# Patient Record
Sex: Female | Born: 1970 | Race: White | Hispanic: Yes | Marital: Single | Smoking: Never smoker
Health system: Southern US, Community
[De-identification: ages and names within clinical notes are randomized; demographics above are authoritative.]

## PROBLEM LIST (undated history)

## (undated) HISTORY — PX: CHOLECYSTECTOMY: SHX55

---

## 1998-09-25 ENCOUNTER — Ambulatory Visit (HOSPITAL_COMMUNITY): Admission: RE | Admit: 1998-09-25 | Discharge: 1998-09-25 | Payer: Self-pay | Admitting: Obstetrics

## 1998-12-23 ENCOUNTER — Inpatient Hospital Stay (HOSPITAL_COMMUNITY): Admission: AD | Admit: 1998-12-23 | Discharge: 1998-12-23 | Payer: Self-pay | Admitting: Obstetrics

## 1998-12-29 ENCOUNTER — Inpatient Hospital Stay (HOSPITAL_COMMUNITY): Admission: AD | Admit: 1998-12-29 | Discharge: 1998-12-29 | Payer: Self-pay | Admitting: *Deleted

## 1999-01-24 ENCOUNTER — Inpatient Hospital Stay (HOSPITAL_COMMUNITY): Admission: AD | Admit: 1999-01-24 | Discharge: 1999-01-24 | Payer: Self-pay | Admitting: *Deleted

## 1999-02-14 ENCOUNTER — Inpatient Hospital Stay (HOSPITAL_COMMUNITY): Admission: AD | Admit: 1999-02-14 | Discharge: 1999-02-16 | Payer: Self-pay | Admitting: *Deleted

## 2004-09-15 ENCOUNTER — Emergency Department (HOSPITAL_COMMUNITY): Admission: EM | Admit: 2004-09-15 | Discharge: 2004-09-15 | Payer: Self-pay | Admitting: Emergency Medicine

## 2006-03-03 ENCOUNTER — Inpatient Hospital Stay (HOSPITAL_COMMUNITY): Admission: AD | Admit: 2006-03-03 | Discharge: 2006-03-03 | Payer: Self-pay | Admitting: Obstetrics and Gynecology

## 2006-04-03 ENCOUNTER — Ambulatory Visit: Payer: Self-pay | Admitting: Family Medicine

## 2006-05-01 ENCOUNTER — Ambulatory Visit: Payer: Self-pay | Admitting: Family Medicine

## 2006-05-04 ENCOUNTER — Ambulatory Visit (HOSPITAL_COMMUNITY): Admission: RE | Admit: 2006-05-04 | Discharge: 2006-05-04 | Payer: Self-pay | Admitting: Obstetrics and Gynecology

## 2006-05-12 ENCOUNTER — Ambulatory Visit: Payer: Self-pay | Admitting: Family Medicine

## 2006-05-30 ENCOUNTER — Ambulatory Visit: Payer: Self-pay | Admitting: Family Medicine

## 2006-06-28 ENCOUNTER — Ambulatory Visit: Payer: Self-pay | Admitting: Family Medicine

## 2006-07-06 ENCOUNTER — Ambulatory Visit: Payer: Self-pay | Admitting: Family Medicine

## 2006-07-20 ENCOUNTER — Ambulatory Visit: Payer: Self-pay | Admitting: *Deleted

## 2006-07-31 ENCOUNTER — Ambulatory Visit (HOSPITAL_COMMUNITY): Admission: RE | Admit: 2006-07-31 | Discharge: 2006-07-31 | Payer: Self-pay | Admitting: Family Medicine

## 2006-08-03 ENCOUNTER — Ambulatory Visit: Payer: Self-pay | Admitting: Obstetrics & Gynecology

## 2006-08-14 ENCOUNTER — Ambulatory Visit: Payer: Self-pay | Admitting: Obstetrics & Gynecology

## 2006-08-21 ENCOUNTER — Ambulatory Visit: Payer: Self-pay | Admitting: Obstetrics & Gynecology

## 2006-08-24 ENCOUNTER — Ambulatory Visit: Payer: Self-pay | Admitting: Gynecology

## 2006-08-28 ENCOUNTER — Ambulatory Visit: Payer: Self-pay | Admitting: Obstetrics & Gynecology

## 2006-08-31 ENCOUNTER — Ambulatory Visit: Payer: Self-pay | Admitting: Obstetrics and Gynecology

## 2006-09-04 ENCOUNTER — Ambulatory Visit: Payer: Self-pay | Admitting: Obstetrics & Gynecology

## 2006-09-07 ENCOUNTER — Ambulatory Visit: Payer: Self-pay | Admitting: Family Medicine

## 2006-09-11 ENCOUNTER — Ambulatory Visit: Payer: Self-pay | Admitting: Family Medicine

## 2006-09-13 ENCOUNTER — Inpatient Hospital Stay (HOSPITAL_COMMUNITY): Admission: AD | Admit: 2006-09-13 | Discharge: 2006-09-14 | Payer: Self-pay | Admitting: Obstetrics and Gynecology

## 2006-09-13 ENCOUNTER — Ambulatory Visit: Payer: Self-pay | Admitting: *Deleted

## 2006-09-14 ENCOUNTER — Ambulatory Visit: Payer: Self-pay | Admitting: Obstetrics & Gynecology

## 2006-09-18 ENCOUNTER — Ambulatory Visit (HOSPITAL_COMMUNITY): Admission: RE | Admit: 2006-09-18 | Discharge: 2006-09-18 | Payer: Self-pay | Admitting: Obstetrics and Gynecology

## 2006-09-18 ENCOUNTER — Ambulatory Visit: Payer: Self-pay | Admitting: *Deleted

## 2006-09-20 ENCOUNTER — Ambulatory Visit: Payer: Self-pay | Admitting: Obstetrics and Gynecology

## 2006-09-20 ENCOUNTER — Inpatient Hospital Stay (HOSPITAL_COMMUNITY): Admission: AD | Admit: 2006-09-20 | Discharge: 2006-09-23 | Payer: Self-pay | Admitting: Obstetrics and Gynecology

## 2008-11-06 ENCOUNTER — Ambulatory Visit: Payer: Self-pay | Admitting: Obstetrics & Gynecology

## 2008-11-06 ENCOUNTER — Encounter: Payer: Self-pay | Admitting: Family Medicine

## 2008-11-06 ENCOUNTER — Other Ambulatory Visit: Admission: RE | Admit: 2008-11-06 | Discharge: 2008-11-06 | Payer: Self-pay | Admitting: Obstetrics & Gynecology

## 2008-11-06 LAB — CONVERTED CEMR LAB
Hemoglobin: 12.3 g/dL (ref 12.0–15.0)
MCHC: 33.3 g/dL (ref 30.0–36.0)
Platelets: 303 10*3/uL (ref 150–400)
RBC: 4.38 M/uL (ref 3.87–5.11)
RDW: 14.2 % (ref 11.5–15.5)
TSH: 1.73 microintl units/mL (ref 0.350–4.500)

## 2008-11-12 ENCOUNTER — Ambulatory Visit (HOSPITAL_COMMUNITY): Admission: RE | Admit: 2008-11-12 | Discharge: 2008-11-12 | Payer: Self-pay | Admitting: Obstetrics & Gynecology

## 2008-12-04 ENCOUNTER — Ambulatory Visit: Payer: Self-pay | Admitting: Family Medicine

## 2009-09-06 ENCOUNTER — Emergency Department (HOSPITAL_COMMUNITY): Admission: EM | Admit: 2009-09-06 | Discharge: 2009-09-07 | Payer: Self-pay | Admitting: Emergency Medicine

## 2009-10-22 ENCOUNTER — Emergency Department (HOSPITAL_COMMUNITY): Admission: EM | Admit: 2009-10-22 | Discharge: 2009-10-22 | Payer: Self-pay | Admitting: Emergency Medicine

## 2009-11-04 ENCOUNTER — Ambulatory Visit (HOSPITAL_COMMUNITY): Admission: RE | Admit: 2009-11-04 | Discharge: 2009-11-04 | Payer: Self-pay | Admitting: Surgery

## 2010-09-28 LAB — PREGNANCY, URINE: Preg Test, Ur: NEGATIVE

## 2010-09-29 LAB — CBC
Platelets: 249 10*3/uL (ref 150–400)
RDW: 13.9 % (ref 11.5–15.5)
WBC: 6.1 10*3/uL (ref 4.0–10.5)
WBC: 7.6 10*3/uL (ref 4.0–10.5)

## 2010-09-29 LAB — COMPREHENSIVE METABOLIC PANEL
ALT: 83 U/L — ABNORMAL HIGH (ref 0–35)
AST: 25 U/L (ref 0–37)
AST: 49 U/L — ABNORMAL HIGH (ref 0–37)
Albumin: 4.2 g/dL (ref 3.5–5.2)
Alkaline Phosphatase: 71 U/L (ref 39–117)
BUN: 10 mg/dL (ref 6–23)
CO2: 29 mEq/L (ref 19–32)
CO2: 27 mEq/L (ref 19–32)
Calcium: 9.7 mg/dL (ref 8.4–10.5)
Calcium: 9.3 mg/dL (ref 8.4–10.5)
Chloride: 104 mEq/L (ref 96–112)
Chloride: 101 mEq/L (ref 96–112)
Creatinine, Ser: 0.81 mg/dL (ref 0.4–1.2)
Creatinine, Ser: 0.64 mg/dL (ref 0.4–1.2)
GFR calc Af Amer: >60 mL/min (ref >60)
GFR calc non Af Amer: >60 mL/min (ref >60)
Glucose, Bld: 86 mg/dL (ref 70–99)
Potassium: 3.7 mEq/L (ref 3.5–5.1)
Sodium: 135 mEq/L (ref 135–145)
Total Bilirubin: 0.4 mg/dL (ref 0.3–1.2)
Total Protein: 8 g/dL (ref 6.0–8.3)

## 2010-09-29 LAB — URINALYSIS, ROUTINE W REFLEX MICROSCOPIC
Bilirubin Urine: NEGATIVE
Bilirubin Urine: NEGATIVE
Glucose, UA: NEGATIVE mg/dL
Hgb urine dipstick: NEGATIVE
Ketones, ur: NEGATIVE mg/dL
Ketones, ur: NEGATIVE mg/dL
Leukocytes, UA: NEGATIVE
Nitrite: NEGATIVE
Nitrite: NEGATIVE
Protein, ur: NEGATIVE mg/dL
Specific Gravity, Urine: 1.012 (ref 1.005–1.030)
Urobilinogen, UA: 0.2 mg/dL (ref 0.0–1.0)
pH: 7.5 (ref 5.0–8.0)
pH: 7 (ref 5.0–8.0)

## 2010-09-29 LAB — DIFFERENTIAL
Basophils Absolute: 0 10*3/uL (ref 0.0–0.1)
Basophils Absolute: 0.1 10*3/uL (ref 0.0–0.1)
Basophils Relative: 0 % (ref 0–1)
Basophils Relative: 1 % (ref 0–1)
Eosinophils Absolute: 0 10*3/uL (ref 0.0–0.7)
Eosinophils Absolute: 0.1 10*3/uL (ref 0.0–0.7)
Eosinophils Relative: 0 % (ref 0–5)
Eosinophils Relative: 1 % (ref 0–5)
Lymphocytes Relative: 32 % (ref 12–46)
Lymphs Abs: 1.9 10*3/uL (ref 0.7–4.0)
Lymphs Abs: 2.4 10*3/uL (ref 0.7–4.0)
Monocytes Absolute: 0.5 10*3/uL (ref 0.1–1.0)
Monocytes Absolute: 0.6 10*3/uL (ref 0.1–1.0)
Monocytes Relative: 8 % (ref 3–12)
Monocytes Relative: 8 % (ref 3–12)
Neutro Abs: 3.6 10*3/uL (ref 1.7–7.7)
Neutrophils Relative %: 59 % (ref 43–77)
Neutrophils Relative %: 60 % (ref 43–77)

## 2010-09-29 LAB — URINE MICROSCOPIC-ADD ON

## 2010-09-29 LAB — URINE CULTURE: Colony Count: 2000

## 2010-09-29 LAB — LIPASE, BLOOD: Lipase: 20 U/L (ref 11–59)

## 2010-09-29 LAB — POCT PREGNANCY, URINE: Preg Test, Ur: NEGATIVE

## 2010-11-23 NOTE — Group Therapy Note (Signed)
NAMESIMARA, Karen Kidd     ACCOUNT NO.:  [id number]   MEDICAL RECORD NO.:  [id number]          PATIENT TYPE:  WOC   LOCATION:  [facility]                   FACILITY:  [facility]   PHYSICIAN:  Allie Bossier, MD        DATE OF BIRTH:  1970-08-24   DATE OF SERVICE:  11/06/2008                                  CLINIC NOTE   [name] is a 40 year old married white gravida 6, para 6 who has been  having heavy periods at any time that she is not pregnant.  She is  currently using condoms for birth control.  Initially she said her  periods are once a month lasting 4 days, but on further elucidation she  does admit that sometimes she will have 2 periods in a month.  She says  that they are very heavy, but she will try one Tylenol without relief.  She has not tried any Motrin to this point.  There has been no workup  done yet.   PHYSICAL EXAMINATION:  Her uterus is normal size and shape, anteverted  and mobile.  Her adnexa are nontender without masses.  She is currently  having her period.   I did an endometrial biopsy, which yielded large amount of tissue.  I am  ordering an ultrasound, a CBC, and TSH and she will follow up when those  results are available.  In the meantime, I have given her prescription  for 800 of Motrin to use every 8 hours.      Allie Bossier, MD     MCD/MEDQ  D:  11/06/2008  T:  11/07/2008  Job:  [id number]

## 2010-11-24 ENCOUNTER — Emergency Department (HOSPITAL_COMMUNITY): Payer: Self-pay

## 2010-11-24 ENCOUNTER — Emergency Department (HOSPITAL_COMMUNITY)
Admission: EM | Admit: 2010-11-24 | Discharge: 2010-11-24 | Disposition: A | Discharge status: Home / Self Care | Payer: Self-pay | Attending: Emergency Medicine | Admitting: Emergency Medicine

## 2010-11-24 ENCOUNTER — Other Ambulatory Visit (HOSPITAL_COMMUNITY): Payer: Self-pay

## 2010-11-24 DIAGNOSIS — R1011 Right upper quadrant pain: Secondary | ICD-10-CM | POA: Insufficient documentation

## 2010-11-24 DIAGNOSIS — Z79899 Other long term (current) drug therapy: Secondary | ICD-10-CM | POA: Insufficient documentation

## 2010-11-24 DIAGNOSIS — G8929 Other chronic pain: Secondary | ICD-10-CM | POA: Insufficient documentation

## 2010-11-24 LAB — COMPREHENSIVE METABOLIC PANEL
ALT: 14 U/L (ref 0–35)
AST: 18 U/L (ref 0–37)
Albumin: 3.6 g/dL (ref 3.5–5.2)
Alkaline Phosphatase: 57 U/L (ref 39–117)
BUN: 9 mg/dL (ref 6–23)
CO2: 24 mEq/L (ref 19–32)
Calcium: 9.1 mg/dL (ref 8.4–10.5)
Chloride: 105 mEq/L (ref 96–112)
Creatinine, Ser: 0.5 mg/dL (ref 0.4–1.2)
GFR calc Af Amer: >60 mL/min (ref >60)
GFR calc non Af Amer: >60 mL/min (ref >60)
Glucose, Bld: 85 mg/dL (ref 70–99)
Potassium: 3.7 mEq/L (ref 3.5–5.1)
Sodium: 139 mEq/L (ref 135–145)
Total Bilirubin: 0.2 mg/dL — ABNORMAL LOW (ref 0.3–1.2)
Total Protein: 7.2 g/dL (ref 6.0–8.3)

## 2010-11-24 LAB — POCT PREGNANCY, URINE: Preg Test, Ur: NEGATIVE

## 2010-11-24 LAB — LIPASE, BLOOD: Lipase: 34 U/L (ref 11–59)

## 2010-11-24 LAB — CBC
HCT: 33.1 % — ABNORMAL LOW (ref 36.0–46.0)
Hemoglobin: 10.6 g/dL — ABNORMAL LOW (ref 12.0–15.0)
MCH: 25.6 pg — ABNORMAL LOW (ref 26.0–34.0)
MCHC: 32 g/dL (ref 30.0–36.0)
MCV: 80 fL (ref 78.0–100.0)
Platelets: 279 10*3/uL (ref 150–400)
RBC: 4.14 MIL/uL (ref 3.87–5.11)
RDW: 13.9 % (ref 11.5–15.5)
WBC: 5.5 10*3/uL (ref 4.0–10.5)

## 2010-11-24 LAB — URINALYSIS, ROUTINE W REFLEX MICROSCOPIC
Glucose, UA: NEGATIVE mg/dL
Nitrite: NEGATIVE
Specific Gravity, Urine: 1.015 (ref 1.005–1.030)
pH: 6.5 (ref 5.0–8.0)

## 2010-11-24 LAB — DIFFERENTIAL
Basophils Absolute: 0 10*3/uL (ref 0.0–0.1)
Basophils Relative: 1 % (ref 0–1)
Eosinophils Absolute: 0.1 10*3/uL (ref 0.0–0.7)
Eosinophils Relative: 1 % (ref 0–5)
Lymphocytes Relative: 29 % (ref 12–46)
Lymphs Abs: 1.6 10*3/uL (ref 0.7–4.0)
Monocytes Absolute: 0.4 10*3/uL (ref 0.1–1.0)
Monocytes Relative: 7 % (ref 3–12)
Neutro Abs: 3.4 10*3/uL (ref 1.7–7.7)
Neutrophils Relative %: 63 % (ref 43–77)

## 2010-11-24 LAB — URINE MICROSCOPIC-ADD ON

## 2010-11-24 MED ORDER — IOHEXOL 300 MG/ML  SOLN
80.0000 mL | Freq: Once | INTRAMUSCULAR | Status: AC | PRN
  Administered 2010-11-24: 80 mL via INTRAVENOUS

## 2012-12-20 IMAGING — CT CT ABD-PELV W/ CM
2 of 5 series · 17 of 46 positions shown, 19 images · IV contrast (APPLIED)
Comparison: 09/15/2004.

CLINICAL DATA: Abdominal pain.  Right upper quadrant pain.

CT ABDOMEN AND PELVIS WITH CONTRAST
TECHNIQUE: Multidetector CT imaging of the abdomen and pelvis was
performed following the standard protocol during bolus
administration of intravenous contrast.
Contrast: 80 ml Zmnipaque-0HH

[Series 2: abd/pelv with 5.0 b31f st · axial · 0.98mm/px · z∈[+802,+1212]mm · 14 of 92 slices shown, 16 images]
[im 5/92  soft-tissue]
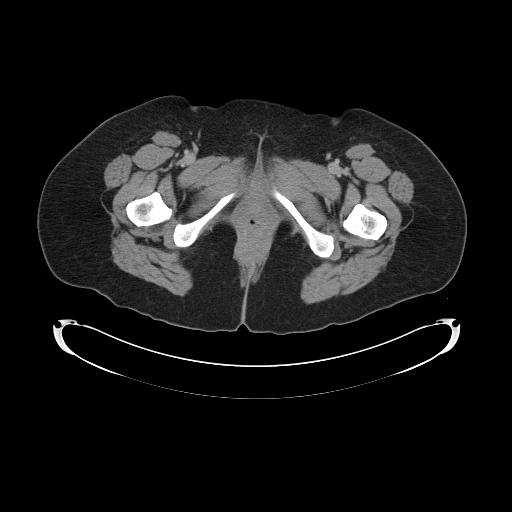
[im 5/92  bone]
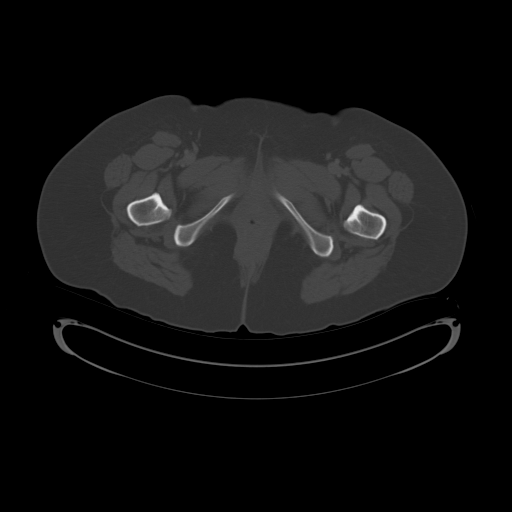
[im 10/92  soft-tissue]
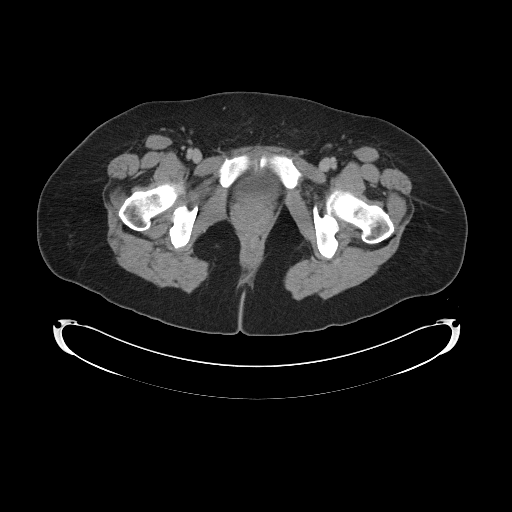
[im 20/92  soft-tissue]
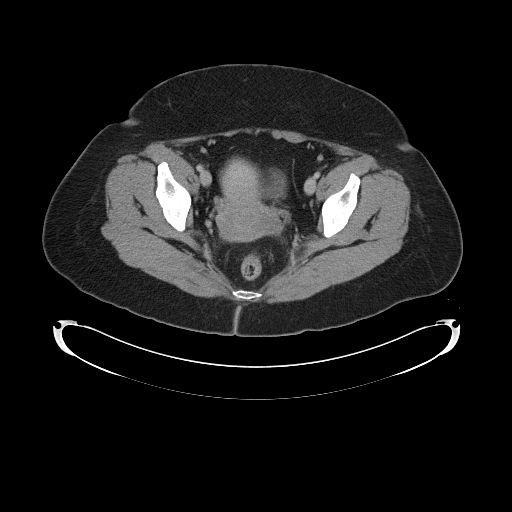
[im 24/92  soft-tissue]
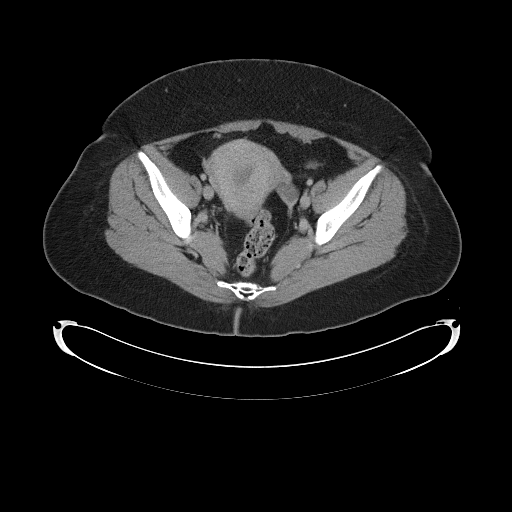
[im 29/92  soft-tissue]
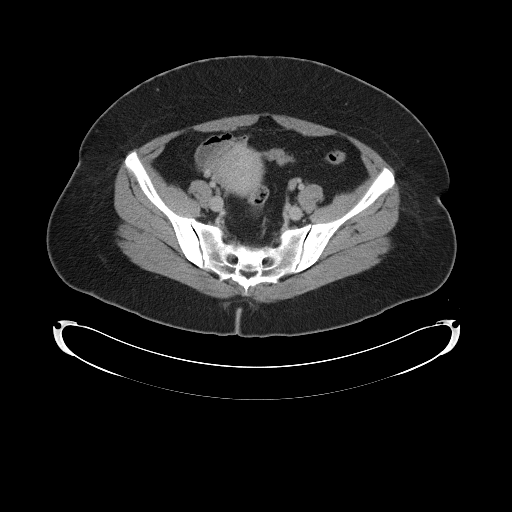
[im 39/92  soft-tissue]
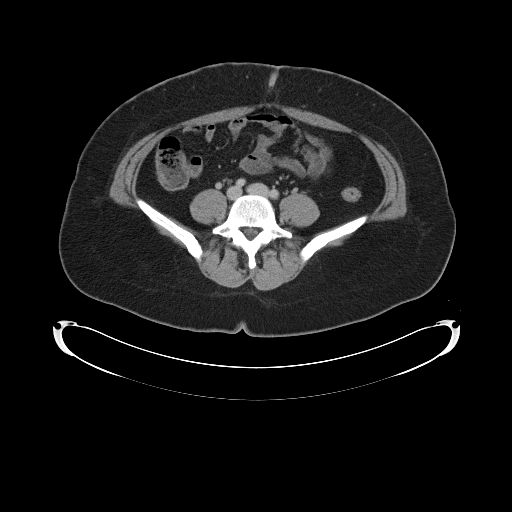
[im 44/92  soft-tissue]
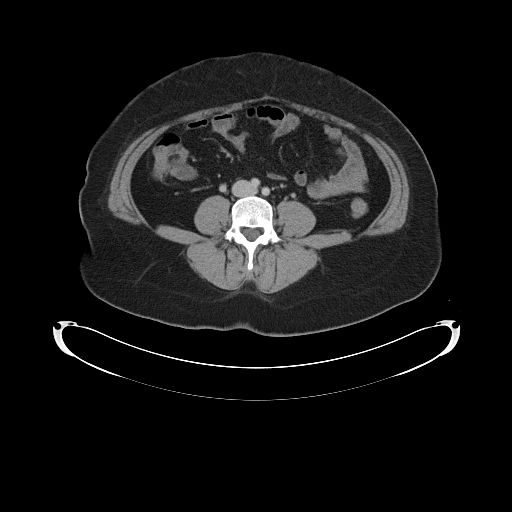
[im 48/92  soft-tissue]
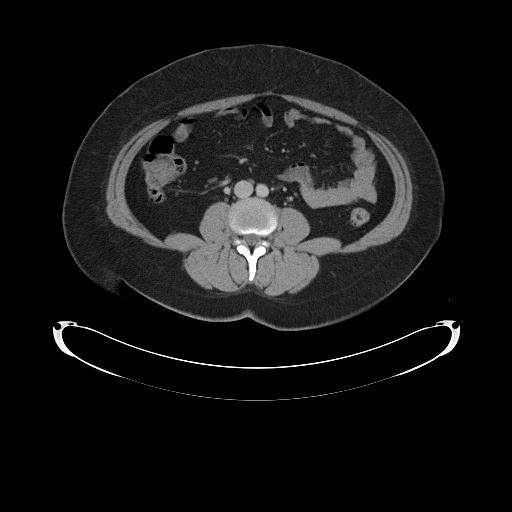
[im 53/92  soft-tissue]
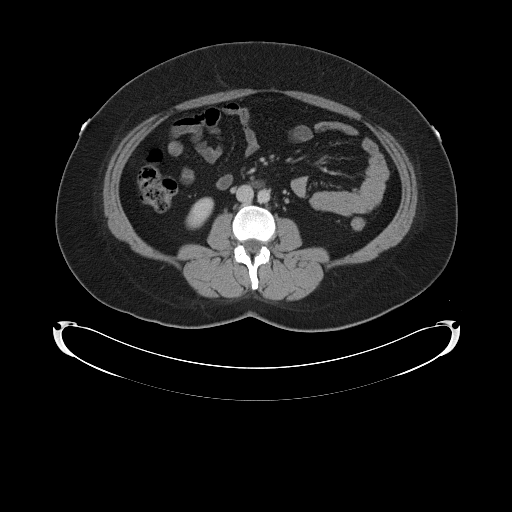
[im 53/92  bone]
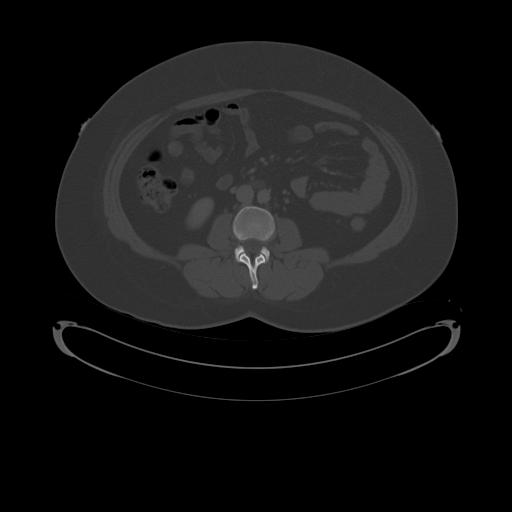
[im 63/92  soft-tissue]
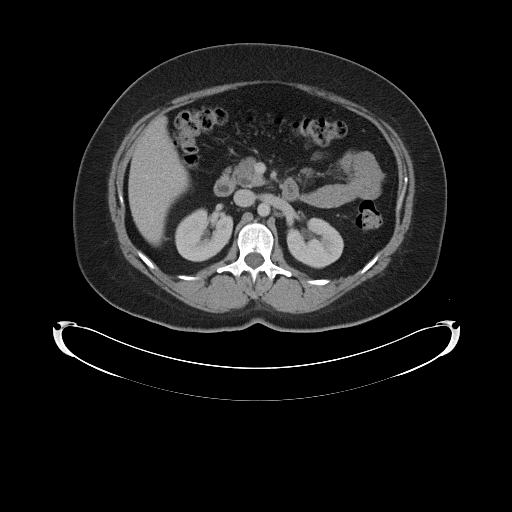
[im 68/92  soft-tissue]
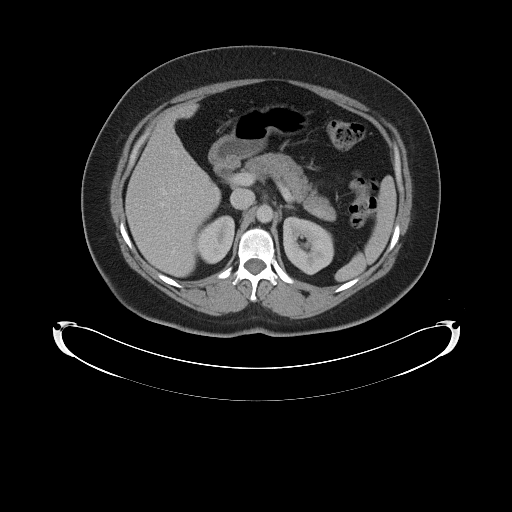
[im 72/92  soft-tissue]
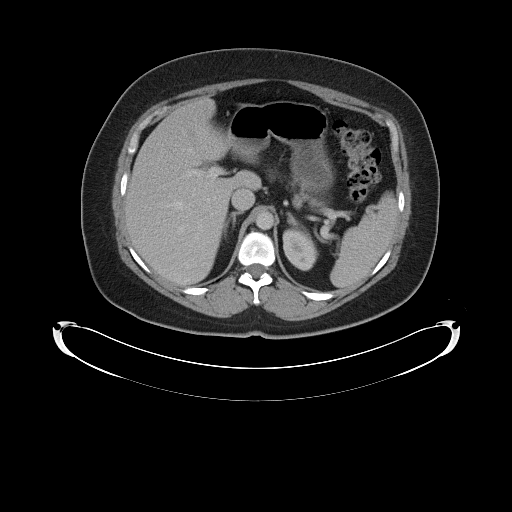
[im 82/92  soft-tissue]
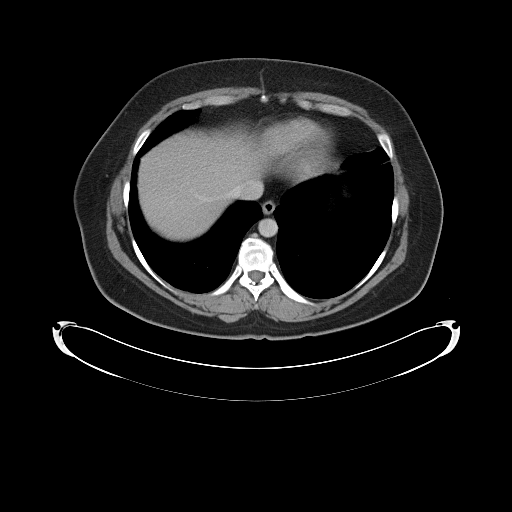
[im 87/92  soft-tissue]
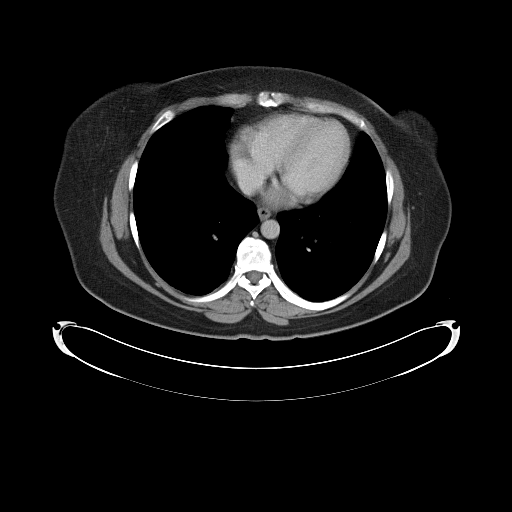

[Series 5: abd/pelv with 2.0 spo cor st · coronal · 0.90mm/px · 3 of 107 slices shown]
[im 36/107  soft-tissue]
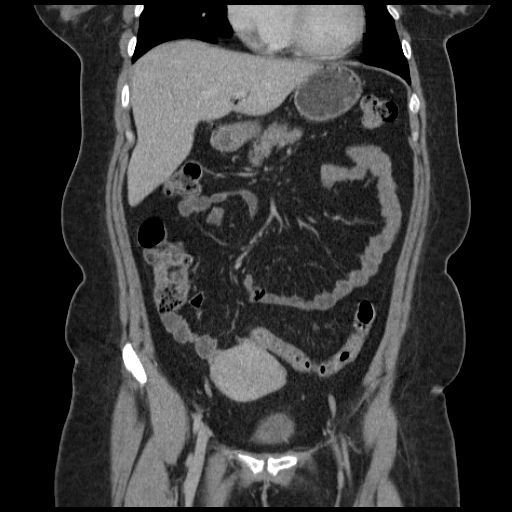
[im 48/107  soft-tissue]
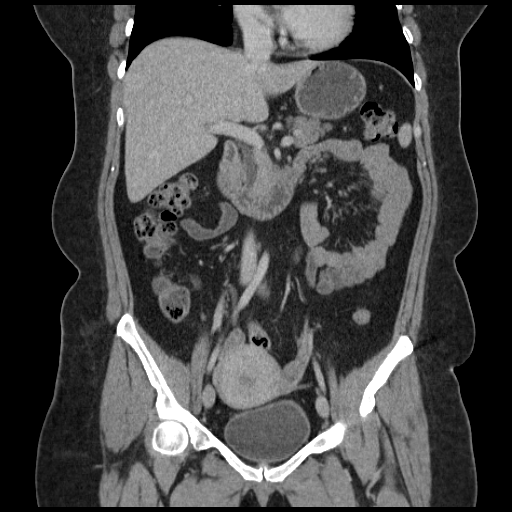
[im 59/107  soft-tissue]
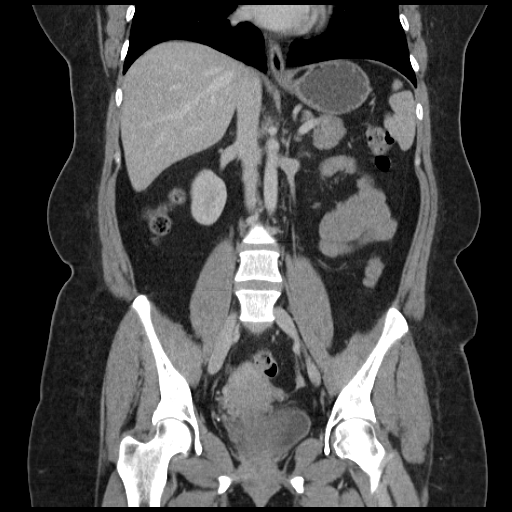

[17 of 46 positions shown; findings below may reference images not displayed]

FINDINGS: The liver, spleen, stomach, duodenum, pancreas, adrenal
glands, and kidneys have normal imaging features. No biliary
dilatation.  The gallbladder is surgically absent.

No abdominal aortic aneurysm.  No abdominal lymphadenopathy.  No
evidence for free fluid in the abdomen.  The abdominal bowel loops
are normal.

Imaging through the pelvis shows no intraperitoneal free fluid.
Uterus is unremarkable.  There is no adnexal mass.  No pelvic
sidewall lymphadenopathy.

No substantial diverticular change in the colon.  A few scattered
diverticuli are noted in the left colon without diverticulitis.
The terminal ileum is normal.  The appendix is normal.

Bone windows reveal no worrisome lytic or sclerotic osseous
lesions.
IMPRESSION: No acute findings in the abdomen or pelvis.  No CT evidence to
explain the patient's history of pain.

## 2012-12-20 IMAGING — CR DG ABDOMEN ACUTE W/ 1V CHEST
3 series · 3 of 3 positions shown · non-contrast
Comparison: None

CLINICAL DATA: Abdominal pain primarily right upper quadrant

ACUTE ABDOMEN SERIES (ABDOMEN 2 VIEW & CHEST 1 VIEW)

[w chest pa]
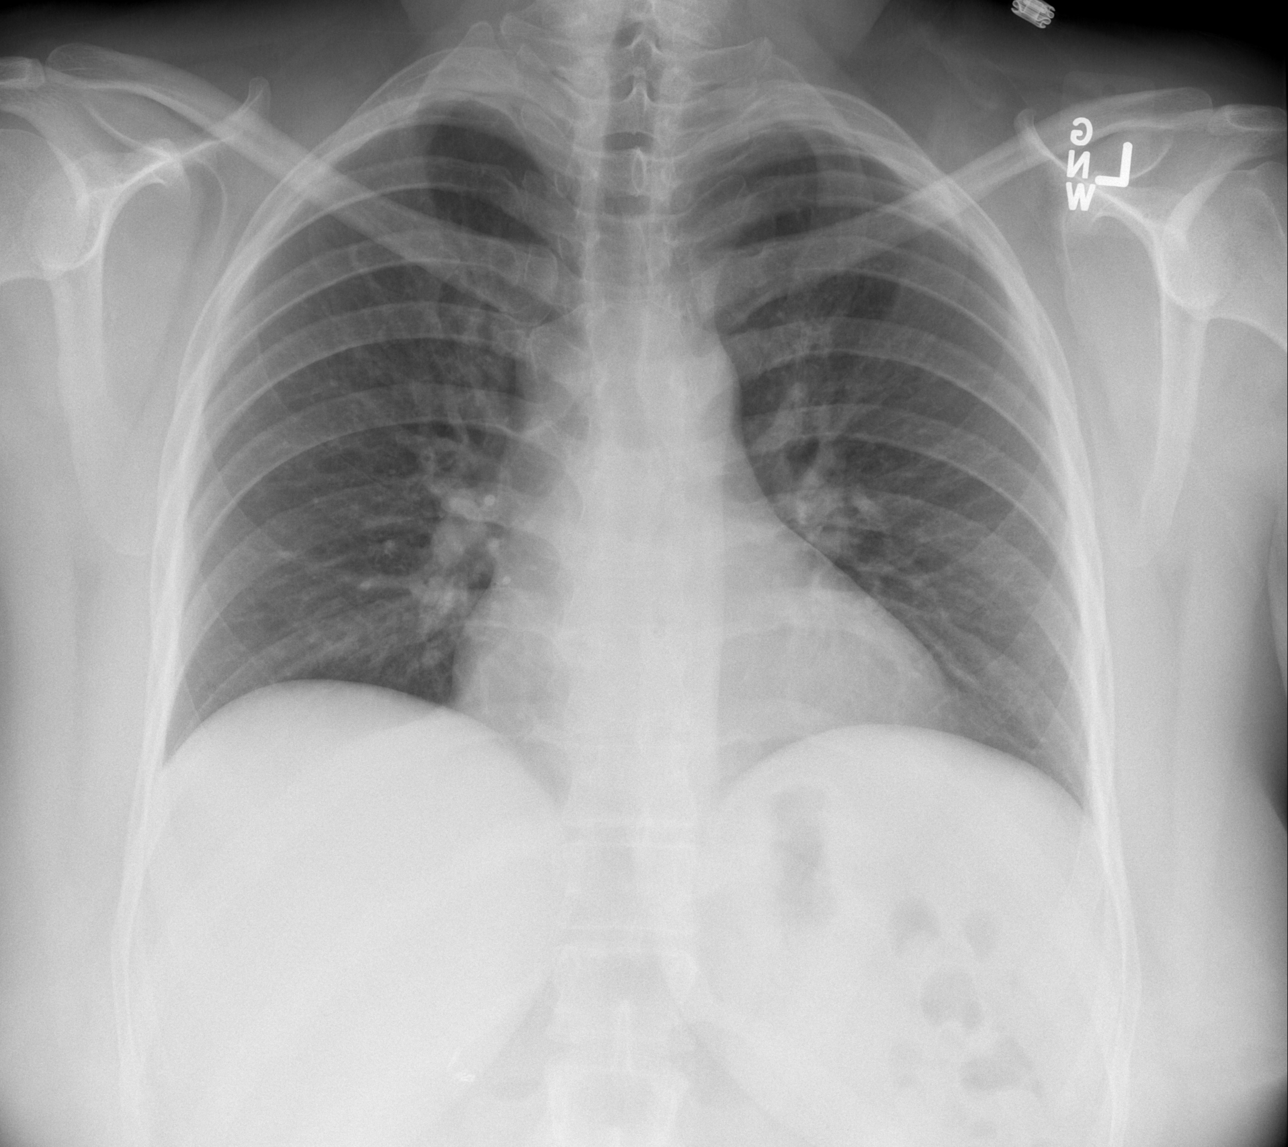

[w abdomen upright]
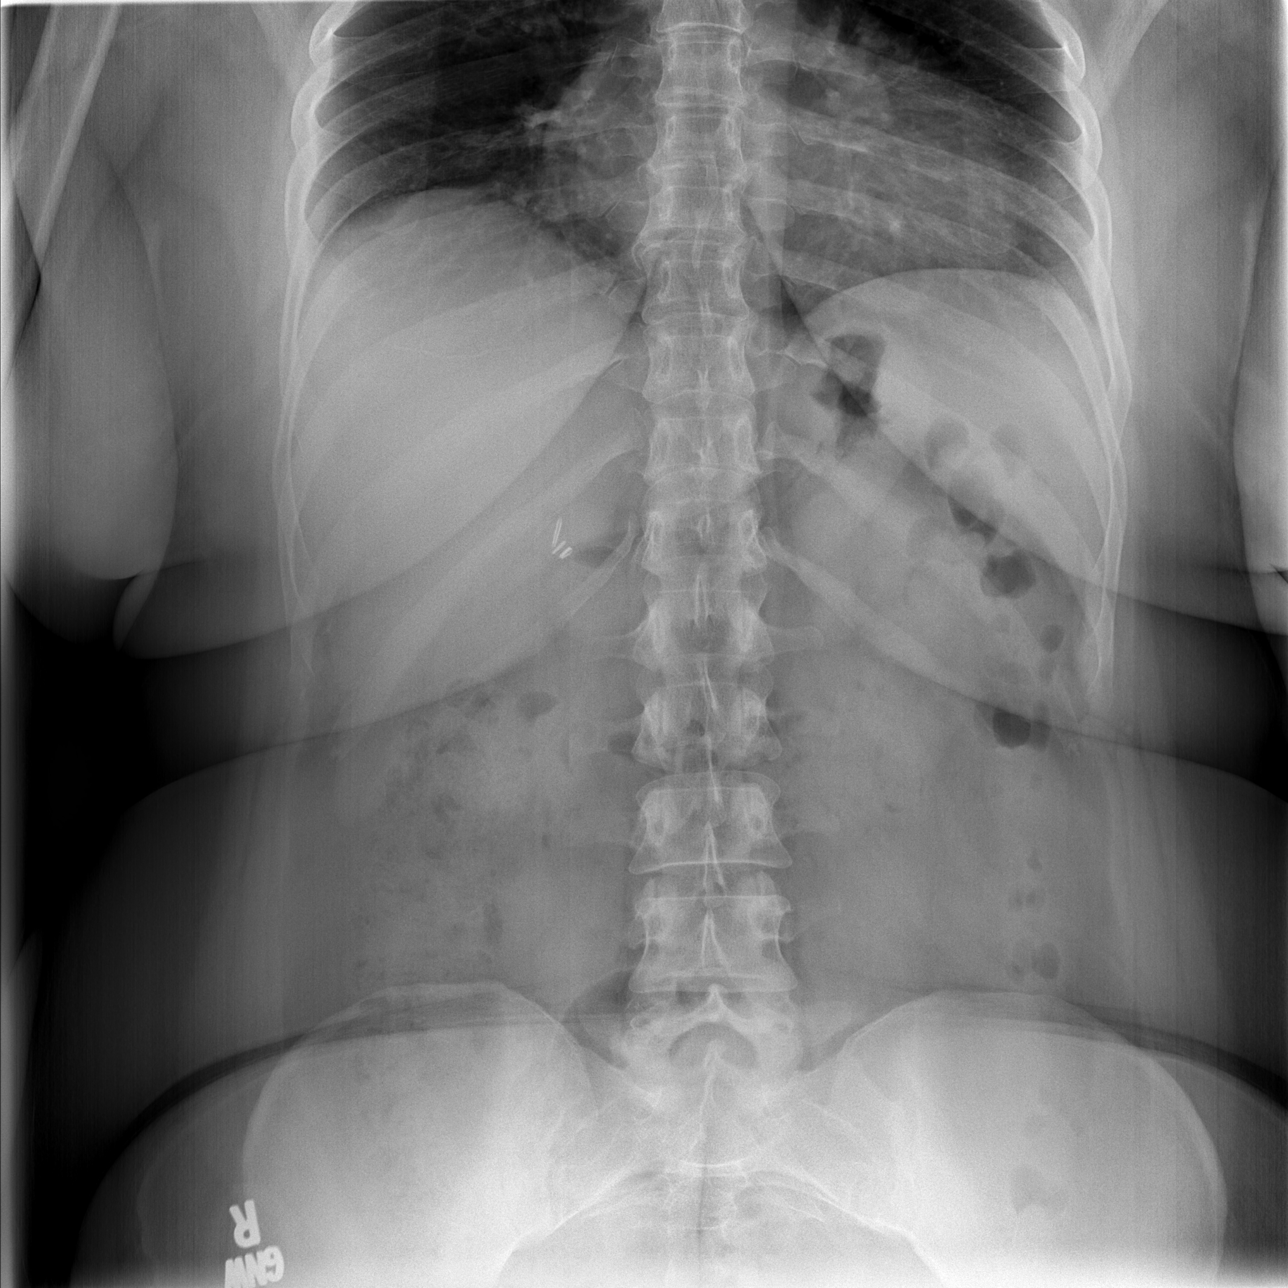

[t abdomen supine]
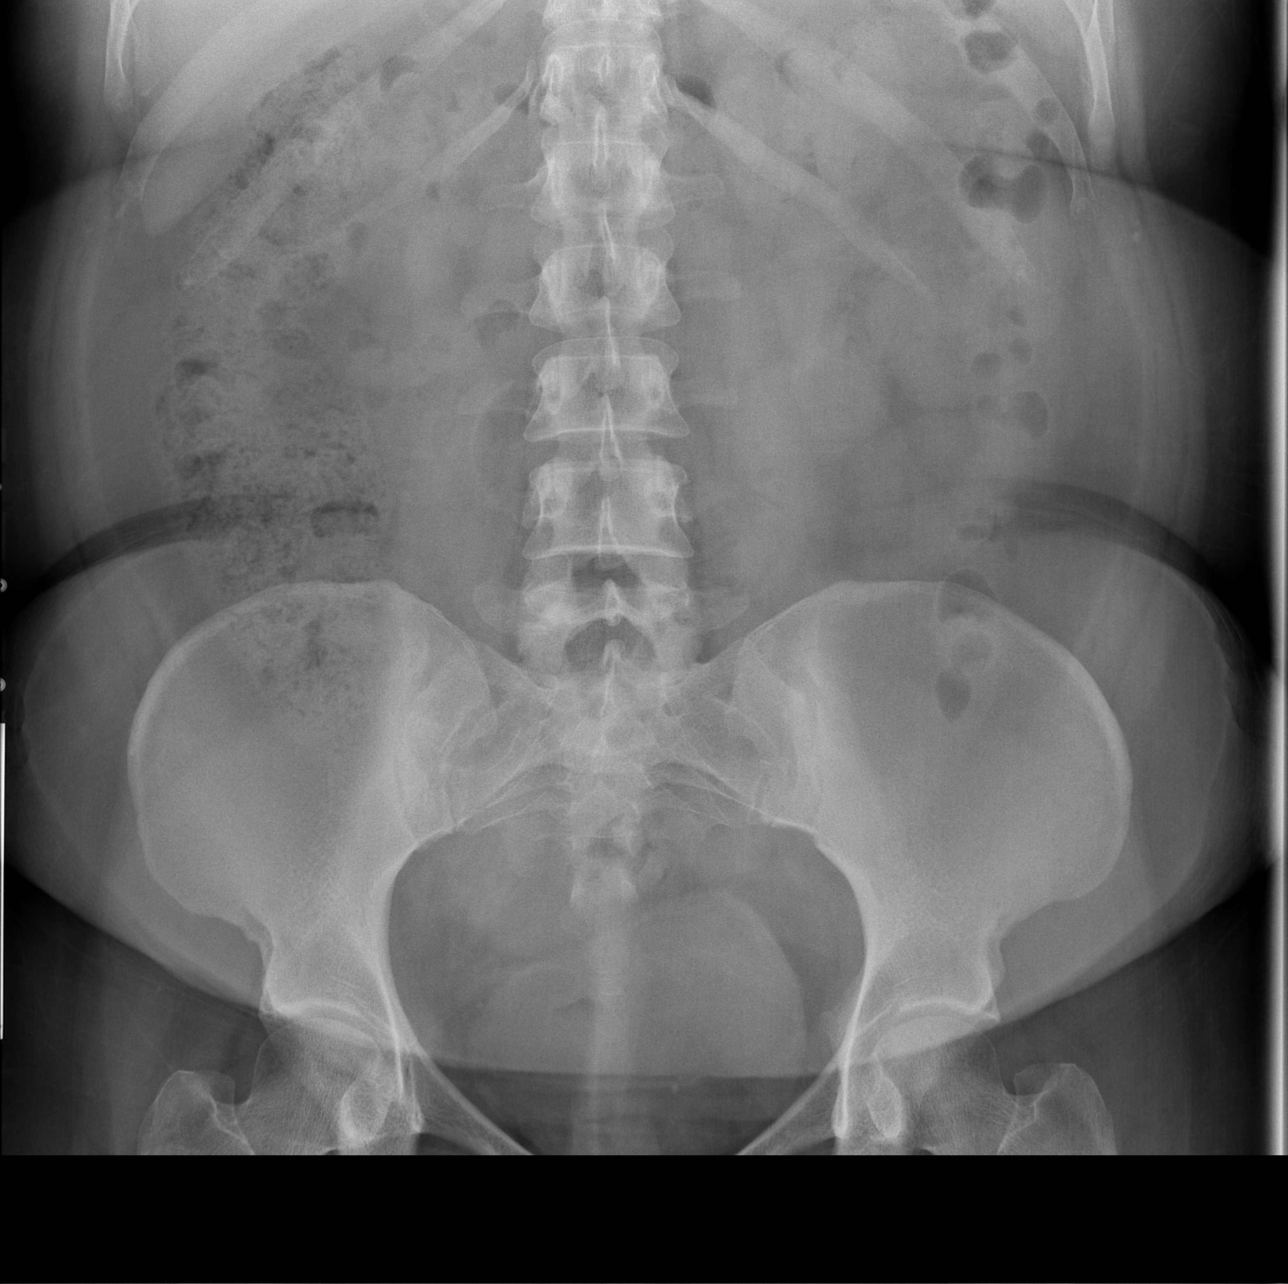

[3 of 3 positions shown; findings below may reference images not displayed]

FINDINGS: Upper-normal size of cardiac silhouette.
Mediastinal contours and pulmonary vascularity normal.
Minimal bronchitic changes without infiltrate or effusion.
Surgical clips right upper quadrant question cholecystectomy.
Nonobstructive bowel gas pattern.
No bowel dilatation, bowel wall thickening, or free intraperitoneal
air.
Tiny calcifications project over pelvis, question phleboliths.
No definite urinary tract calcification or acute osseous
abnormalities.
IMPRESSION: No definite acute abnormalities.
Minimal bronchitic changes.

## 2020-05-10 ENCOUNTER — Emergency Department (HOSPITAL_COMMUNITY): Payer: Self-pay

## 2020-05-10 ENCOUNTER — Emergency Department (HOSPITAL_COMMUNITY)
Admission: EM | Admit: 2020-05-10 | Discharge: 2020-05-10 | Disposition: A | Discharge status: Home / Self Care | Payer: Self-pay | Attending: Emergency Medicine | Admitting: Emergency Medicine

## 2020-05-10 ENCOUNTER — Encounter (HOSPITAL_COMMUNITY): Payer: Self-pay

## 2020-05-10 ENCOUNTER — Other Ambulatory Visit: Payer: Self-pay

## 2020-05-10 DIAGNOSIS — M542 Cervicalgia: Secondary | ICD-10-CM | POA: Insufficient documentation

## 2020-05-10 DIAGNOSIS — S0990XA Unspecified injury of head, initial encounter: Secondary | ICD-10-CM | POA: Insufficient documentation

## 2020-05-10 MED ORDER — ACETAMINOPHEN 500 MG PO TABS
1000.0000 mg | ORAL_TABLET | Freq: Once | ORAL | Status: AC
Start: 2020-05-10 — End: 2020-05-10
  Administered 2020-05-10: 1000 mg via ORAL
  Filled 2020-05-10: qty 2

## 2020-05-10 NOTE — Discharge Instructions (Addendum)
You were evaluated in the Emergency Department and after careful evaluation, we did not find any emergent condition requiring admission or further testing in the hospital.  Your exam/testing today was overall reassuring.  CT scans did not show any emergencies.  You have a hematoma on your forehead in the back of your head, which should heal with time.  You may also have a mild concussion.  We recommend mental and physical rest today.  If you feel completely back to normal tomorrow, you should be able to return to work.  If at work you experience return of headache or dizziness or trouble concentrating, we recommend leaving work and returning to mental and physical rest and following up with your regular doctor for reassessment of possible concussion.  Please return to the Emergency Department if you experience any worsening of your condition.  Thank you for allowing [place] to be a part of your care.
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
## 2020-05-10 NOTE — ED Notes (Signed)
Pt ambulatory to CT with tech

## 2024-03-11 ENCOUNTER — Emergency Department (HOSPITAL_COMMUNITY)
Admission: EM | Admit: 2024-03-11 | Discharge: 2024-03-11 | Disposition: A | Discharge status: Home / Self Care | Payer: Self-pay | Attending: Emergency Medicine | Admitting: Emergency Medicine

## 2024-03-11 ENCOUNTER — Emergency Department (HOSPITAL_COMMUNITY): Payer: Self-pay

## 2024-03-11 ENCOUNTER — Other Ambulatory Visit: Payer: Self-pay

## 2024-03-11 DIAGNOSIS — S82851A Displaced trimalleolar fracture of right lower leg, initial encounter for closed fracture: Secondary | ICD-10-CM | POA: Insufficient documentation

## 2024-03-11 DIAGNOSIS — W108XXA Fall (on) (from) other stairs and steps, initial encounter: Secondary | ICD-10-CM | POA: Insufficient documentation

## 2024-03-11 MED ORDER — OXYCODONE-ACETAMINOPHEN 5-325 MG PO TABS: 1.0000 | ORAL_TABLET | ORAL | 0 refills | Status: DC | PRN

## 2024-03-11 MED ORDER — ONDANSETRON HCL 4 MG/2ML IJ SOLN
4.0000 mg | Freq: Once | INTRAMUSCULAR | Status: AC
  Administered 2024-03-11: 4 mg via INTRAVENOUS
  Filled 2024-03-11: qty 2

## 2024-03-11 MED ORDER — MORPHINE SULFATE (PF) 4 MG/ML IV SOLN
4.0000 mg | Freq: Once | INTRAVENOUS | Status: AC
  Administered 2024-03-11: 4 mg via INTRAVENOUS
  Filled 2024-03-11: qty 1

## 2024-03-11 MED ORDER — FENTANYL CITRATE PF 50 MCG/ML IJ SOSY
50.0000 ug | PREFILLED_SYRINGE | Freq: Once | INTRAMUSCULAR | Status: AC
  Administered 2024-03-11: 50 ug via INTRAVENOUS
  Filled 2024-03-11: qty 1

## 2024-03-11 MED ORDER — SODIUM CHLORIDE 0.9 % IV BOLUS
1000.0000 mL | Freq: Once | INTRAVENOUS | Status: AC
  Administered 2024-03-11: 1000 mL via INTRAVENOUS

## 2024-03-11 MED ORDER — BACITRACIN ZINC 500 UNIT/GM EX OINT
TOPICAL_OINTMENT | Freq: Two times a day (BID) | CUTANEOUS | Status: DC
  Filled 2024-03-11: qty 1.8

## 2024-03-11 MED ORDER — IBUPROFEN 800 MG PO TABS: 800.0000 mg | ORAL_TABLET | Freq: Three times a day (TID) | ORAL | 0 refills | Status: DC
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
## 2024-03-11 NOTE — Progress Notes (Signed)
 Orthopedic Tech Progress Note Patient Details:  Karen Kidd August 19, 1970 [id number]  Ortho Devices Type of Ortho Device: Post (short leg) splint, Stirrup splint Ortho Device/Splint Location: rle Ortho Device/Splint Interventions: Ordered, Application, Adjustment  I applied splint with assist from rn. Post Interventions Patient Tolerated: Well Instructions Provided: Care of device, Adjustment of device  [name] PARAS 03/11/2024, 2:13 AM

## 2024-03-11 NOTE — Progress Notes (Signed)
 Orthopedic Tech Progress Note Patient Details:  Karen Kidd 1970-07-20 [id number]  Ortho Devices Type of Ortho Device: Crutches Ortho Device/Splint Location: rle Ortho Device/Splint Interventions: Ordered, Application, Adjustment   Post Interventions Patient Tolerated: Well Instructions Provided: Care of device, Adjustment of device  [name] PARAS 03/11/2024, 5:03 AM

## 2024-03-11 NOTE — Discharge Instructions (Signed)
 Take the prescribed medication as directed.  Keep ankle elevated to prevent worsening swelling. Follow-up with Dr. Celena-- office is closed today, call tomorrow (Tuesday) to get appt scheduled. Return to the ED for new or worsening symptoms.
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
## 2024-03-20 ENCOUNTER — Other Ambulatory Visit: Payer: Self-pay

## 2024-03-20 ENCOUNTER — Encounter (HOSPITAL_COMMUNITY): Payer: Self-pay | Admitting: Orthopedic Surgery

## 2024-03-20 NOTE — Progress Notes (Signed)
 SDW call  Patient's daughter Karen Kidd was given pre-op instructions over the phone utilizing [address] [place], [place] [address]. She verbalized understanding of instructions provided. She denied patient hass any SOB, fever or cough    PCP - Tinnie Molt Cardiologist -  Pulmonary:    PPM/ICD - denies Device Orders - na Rep Notified - na   Chest x-ray - na EKG -  na Stress Test - ECHO -  Cardiac Cath -   Sleep Study/sleep apnea/CPAP: denies  Non-diabetic  Blood Thinner Instructions: denies Aspirin  Instructions:denies   ERAS Protcol - Clears until 0750   Anesthesia review: No  Your procedure is scheduled on Thursday March 21, 2024  Report to [facility] Main Entrance A at  0820  A.M., then check in with the Admitting office.  Call this number if you have problems the morning of surgery:  [phone]   If you have any questions prior to your surgery date call [phone]: Open Monday-Friday 8am-4pm If you experience any cold or flu symptoms such as cough, fever, chills, shortness of breath, etc. between now and your scheduled surgery, please notify us  at the above number     Remember:  Do not eat after midnight the night before your surgery  You may drink clear liquids until   0750  the morning of your surgery.   Clear liquids allowed are: Water, Non-Citrus Juices (without pulp), Carbonated Beverages, Clear Tea, Black Coffee ONLY (NO MILK, CREAM OR POWDERED CREAMER of any kind), and Gatorade   Take these medicines if needed the morning of surgery with A SIP OF WATER:  Percocet  As of today, STOP taking any Aspirin  (unless otherwise instructed by your surgeon) Aleve, Naproxen, Ibuprofen , Motrin , Advil , Goody's, BC's, all herbal medications, fish oil, and all vitamins.

## 2024-03-21 ENCOUNTER — Encounter (HOSPITAL_COMMUNITY): Payer: Self-pay | Admitting: Orthopedic Surgery

## 2024-03-21 ENCOUNTER — Ambulatory Visit (HOSPITAL_COMMUNITY): Payer: Self-pay

## 2024-03-21 ENCOUNTER — Ambulatory Visit (HOSPITAL_COMMUNITY)
Admission: RE | Admit: 2024-03-21 | Discharge: 2024-03-21 | Disposition: A | Discharge status: Home / Self Care | Payer: Self-pay | Attending: Orthopedic Surgery | Admitting: Orthopedic Surgery

## 2024-03-21 ENCOUNTER — Ambulatory Visit (HOSPITAL_BASED_OUTPATIENT_CLINIC_OR_DEPARTMENT_OTHER): Payer: Self-pay | Admitting: Anesthesiology

## 2024-03-21 ENCOUNTER — Other Ambulatory Visit (HOSPITAL_COMMUNITY): Payer: Self-pay

## 2024-03-21 ENCOUNTER — Encounter (HOSPITAL_COMMUNITY)
Admission: RE | Disposition: A | Discharge status: Home / Self Care | Payer: Self-pay | Source: Home / Self Care | Attending: Orthopedic Surgery

## 2024-03-21 ENCOUNTER — Other Ambulatory Visit: Payer: Self-pay

## 2024-03-21 ENCOUNTER — Ambulatory Visit (HOSPITAL_COMMUNITY): Payer: Self-pay | Admitting: Anesthesiology

## 2024-03-21 DIAGNOSIS — Z01818 Encounter for other preprocedural examination: Secondary | ICD-10-CM

## 2024-03-21 DIAGNOSIS — S9304XA Dislocation of right ankle joint, initial encounter: Secondary | ICD-10-CM | POA: Insufficient documentation

## 2024-03-21 DIAGNOSIS — S93431A Sprain of tibiofibular ligament of right ankle, initial encounter: Secondary | ICD-10-CM | POA: Insufficient documentation

## 2024-03-21 DIAGNOSIS — S82851A Displaced trimalleolar fracture of right lower leg, initial encounter for closed fracture: Secondary | ICD-10-CM

## 2024-03-21 DIAGNOSIS — W109XXA Fall (on) (from) unspecified stairs and steps, initial encounter: Secondary | ICD-10-CM | POA: Insufficient documentation

## 2024-03-21 HISTORY — PX: ORIF ANKLE FRACTURE: SHX5408

## 2024-03-21 HISTORY — PX: SYNDESMOSIS REPAIR: SHX5182

## 2024-03-21 LAB — BASIC METABOLIC PANEL WITH GFR
Anion gap: 14 (ref 5–15)
BUN: 11 mg/dL (ref 6–20)
CO2: 21 mmol/L — ABNORMAL LOW (ref 22–32)
Calcium: 9.5 mg/dL (ref 8.9–10.3)
Chloride: 104 mmol/L (ref 98–111)
Creatinine, Ser: 0.77 mg/dL (ref 0.44–1.00)
GFR, Estimated: >60 mL/min (ref >60)
Glucose, Bld: 95 mg/dL (ref 70–99)
Potassium: 4.5 mmol/L (ref 3.5–5.1)
Sodium: 139 mmol/L (ref 135–145)

## 2024-03-21 LAB — CBC WITH DIFFERENTIAL/PLATELET
Abs Immature Granulocytes: 0.03 K/uL (ref 0.00–0.07)
Basophils Absolute: 0.1 K/uL (ref 0.0–0.1)
Basophils Relative: 1 %
Eosinophils Absolute: 0.1 K/uL (ref 0.0–0.5)
Eosinophils Relative: 1 %
HCT: 43.3 % (ref 36.0–46.0)
Hemoglobin: 14.1 g/dL (ref 12.0–15.0)
Immature Granulocytes: 1 %
Lymphocytes Relative: 24 %
Lymphs Abs: 1.5 K/uL (ref 0.7–4.0)
MCH: 28.3 pg (ref 26.0–34.0)
MCHC: 32.6 g/dL (ref 30.0–36.0)
MCV: 86.8 fL (ref 80.0–100.0)
Monocytes Absolute: 0.5 K/uL (ref 0.1–1.0)
Monocytes Relative: 7 %
Neutro Abs: 4.4 K/uL (ref 1.7–7.7)
Neutrophils Relative %: 66 %
Platelets: 334 K/uL (ref 150–400)
RBC: 4.99 MIL/uL (ref 3.87–5.11)
RDW: 13 % (ref 11.5–15.5)
WBC: 6.5 K/uL (ref 4.0–10.5)
nRBC: 0 % (ref 0.0–0.2)

## 2024-03-21 LAB — VITAMIN D 25 HYDROXY (VIT D DEFICIENCY, FRACTURES): Vit D, 25-Hydroxy: 14.8 ng/mL — ABNORMAL LOW (ref 30–100)

## 2024-03-21 SURGERY — OPEN REDUCTION INTERNAL FIXATION (ORIF) ANKLE FRACTURE
Anesthesia: Regional | Site: Ankle | Laterality: Right

## 2024-03-21 MED ORDER — CELECOXIB 200 MG PO CAPS: 200.0000 mg | ORAL_CAPSULE | Freq: Once | ORAL | Status: DC

## 2024-03-21 MED ORDER — ACETAMINOPHEN 500 MG PO TABS: 1000.0000 mg | ORAL_TABLET | Freq: Once | ORAL | Status: DC

## 2024-03-21 MED ORDER — OXYCODONE HCL 5 MG/5ML PO SOLN: 5.0000 mg | Freq: Once | ORAL | Status: DC | PRN

## 2024-03-21 MED ORDER — CELECOXIB 200 MG PO CAPS
200.0000 mg | ORAL_CAPSULE | Freq: Once | ORAL | Status: DC
  Filled 2024-03-21: qty 1

## 2024-03-21 MED ORDER — OXYCODONE HCL 5 MG PO TABS: 5.0000 mg | ORAL_TABLET | Freq: Once | ORAL | Status: DC | PRN

## 2024-03-21 MED ORDER — ORAL CARE MOUTH RINSE: 15.0000 mL | Freq: Once | OROMUCOSAL | Status: AC

## 2024-03-21 MED ORDER — ONDANSETRON HCL 4 MG/2ML IJ SOLN: 4.0000 mg | Freq: Once | INTRAMUSCULAR | Status: DC | PRN

## 2024-03-21 MED ORDER — PROPOFOL 10 MG/ML IV BOLUS
INTRAVENOUS | Status: AC
  Filled 2024-03-21: qty 20

## 2024-03-21 MED ORDER — ONDANSETRON HCL 4 MG/2ML IJ SOLN
INTRAMUSCULAR | Status: DC | PRN
  Administered 2024-03-21: 4 mg via INTRAVENOUS

## 2024-03-21 MED ORDER — BUPIVACAINE HCL (PF) 0.5 % IJ SOLN
INTRAMUSCULAR | Status: DC | PRN
  Administered 2024-03-21: 10 mL via PERINEURAL

## 2024-03-21 MED ORDER — CHLORHEXIDINE GLUCONATE 0.12 % MT SOLN
15.0000 mL | Freq: Once | OROMUCOSAL | Status: AC
  Administered 2024-03-21: 15 mL via OROMUCOSAL
  Filled 2024-03-21: qty 15

## 2024-03-21 MED ORDER — ASPIRIN 81 MG PO TBEC
81.0000 mg | DELAYED_RELEASE_TABLET | Freq: Every day | ORAL | 0 refills | Status: AC
  Filled 2024-03-21: qty 30, 30d supply, fill #0

## 2024-03-21 MED ORDER — DEXAMETHASONE SODIUM PHOSPHATE 10 MG/ML IJ SOLN
INTRAMUSCULAR | Status: DC | PRN
  Administered 2024-03-21: 10 mg via INTRAVENOUS

## 2024-03-21 MED ORDER — PHENYLEPHRINE HCL-NACL 20-0.9 MG/250ML-% IV SOLN
INTRAVENOUS | Status: DC | PRN
  Administered 2024-03-21: 50 ug/min via INTRAVENOUS

## 2024-03-21 MED ORDER — ACETAMINOPHEN 500 MG PO TABS
1000.0000 mg | ORAL_TABLET | Freq: Once | ORAL | Status: AC
  Administered 2024-03-21: 1000 mg via ORAL

## 2024-03-21 MED ORDER — OXYCODONE-ACETAMINOPHEN 7.5-325 MG PO TABS
1.0000 | ORAL_TABLET | Freq: Four times a day (QID) | ORAL | 0 refills | Status: AC | PRN
  Filled 2024-03-21: qty 40, 7d supply, fill #0

## 2024-03-21 MED ORDER — PROPOFOL 10 MG/ML IV BOLUS
INTRAVENOUS | Status: DC | PRN
  Administered 2024-03-21: 200 mg via INTRAVENOUS

## 2024-03-21 MED ORDER — LACTATED RINGERS IV SOLN
INTRAVENOUS | Status: DC
Start: 2024-03-21 — End: 2024-03-21

## 2024-03-21 MED ORDER — FENTANYL CITRATE (PF) 100 MCG/2ML IJ SOLN: 50.0000 ug | Freq: Once | INTRAMUSCULAR | Status: AC

## 2024-03-21 MED ORDER — KETOROLAC TROMETHAMINE 10 MG PO TABS
10.0000 mg | ORAL_TABLET | Freq: Four times a day (QID) | ORAL | 0 refills | Status: AC | PRN
  Filled 2024-03-21: qty 20, 30d supply, fill #0

## 2024-03-21 MED ORDER — ROPIVACAINE HCL 5 MG/ML IJ SOLN
INTRAMUSCULAR | Status: DC | PRN
  Administered 2024-03-21: 20 mL via PERINEURAL

## 2024-03-21 MED ORDER — DEXAMETHASONE SODIUM PHOSPHATE 10 MG/ML IJ SOLN
INTRAMUSCULAR | Status: AC
Start: 2024-03-21 — End: 2024-03-21
  Filled 2024-03-21: qty 1

## 2024-03-21 MED ORDER — FENTANYL CITRATE (PF) 100 MCG/2ML IJ SOLN: 25.0000 ug | INTRAMUSCULAR | Status: DC | PRN

## 2024-03-21 MED ORDER — ONDANSETRON HCL 4 MG/2ML IJ SOLN
INTRAMUSCULAR | Status: AC
  Filled 2024-03-21: qty 2

## 2024-03-21 MED ORDER — CELECOXIB 200 MG PO CAPS
200.0000 mg | ORAL_CAPSULE | Freq: Once | ORAL | Status: AC
  Administered 2024-03-21: 200 mg via ORAL

## 2024-03-21 MED ORDER — CEFAZOLIN SODIUM-DEXTROSE 2-4 GM/100ML-% IV SOLN
2.0000 g | INTRAVENOUS | Status: AC
  Administered 2024-03-21: 2 g via INTRAVENOUS
  Filled 2024-03-21: qty 100

## 2024-03-21 MED ORDER — MEPERIDINE HCL 25 MG/ML IJ SOLN
6.2500 mg | INTRAMUSCULAR | Status: DC | PRN
  Filled 2024-03-21: qty 1

## 2024-03-21 MED ORDER — ONDANSETRON 4 MG PO TBDP
4.0000 mg | ORAL_TABLET | Freq: Three times a day (TID) | ORAL | 0 refills | Status: AC | PRN
  Filled 2024-03-21: qty 20, 20d supply, fill #0

## 2024-03-21 MED ORDER — METHOCARBAMOL 500 MG PO TABS
500.0000 mg | ORAL_TABLET | Freq: Four times a day (QID) | ORAL | 0 refills | Status: AC | PRN
  Filled 2024-03-21: qty 60, 8d supply, fill #0

## 2024-03-21 MED ORDER — FENTANYL CITRATE (PF) 100 MCG/2ML IJ SOLN
INTRAMUSCULAR | Status: AC
  Administered 2024-03-21: 50 ug via INTRAVENOUS
  Filled 2024-03-21: qty 2

## 2024-03-21 MED ORDER — VITAMIN D3 125 MCG (5000 UT) PO TABS
ORAL_TABLET | Freq: Every day | ORAL | 6 refills | Status: AC
  Filled 2024-03-21: qty 30, 30d supply, fill #0

## 2024-03-21 MED ORDER — MIDAZOLAM HCL 2 MG/2ML IJ SOLN: 1.0000 mg | Freq: Once | INTRAMUSCULAR | Status: AC

## 2024-03-21 MED ORDER — PHENYLEPHRINE 80 MCG/ML (10ML) SYRINGE FOR IV PUSH (FOR BLOOD PRESSURE SUPPORT)
PREFILLED_SYRINGE | INTRAVENOUS | Status: DC | PRN
Start: 2024-03-21 — End: 2024-03-21
  Administered 2024-03-21: 160 ug via INTRAVENOUS
  Administered 2024-03-21: 120 ug via INTRAVENOUS

## 2024-03-21 MED ORDER — ACETAMINOPHEN 500 MG PO TABS
1000.0000 mg | ORAL_TABLET | Freq: Once | ORAL | Status: DC
  Filled 2024-03-21: qty 2

## 2024-03-21 MED ORDER — 0.9 % SODIUM CHLORIDE (POUR BTL) OPTIME
TOPICAL | Status: DC | PRN
  Administered 2024-03-21: 1000 mL

## 2024-03-21 MED ORDER — MIDAZOLAM HCL 2 MG/2ML IJ SOLN
INTRAMUSCULAR | Status: AC
  Administered 2024-03-21: 1 mg via INTRAVENOUS
  Filled 2024-03-21: qty 2

## 2024-03-21 MED ORDER — BUPIVACAINE LIPOSOME 1.3 % IJ SUSP
INTRAMUSCULAR | Status: DC | PRN
Start: 2024-03-21 — End: 2024-03-21
  Administered 2024-03-21: 10 mL via PERINEURAL

## 2024-03-21 SURGICAL SUPPLY — 66 items
BAG COUNTER SPONGE SURGICOUNT (BAG) ×3 IMPLANT
BANDAGE ESMARK 6X9 LF (GAUZE/BANDAGES/DRESSINGS) ×3 IMPLANT
BIT DRILL 2.4 AO COUPLING CANN (BIT) IMPLANT
BIT DRILL SHORT 2.0 ZI (BIT) IMPLANT
BIT DRILL SHORT 2.5 (BIT) IMPLANT
BIT OVERDRILL 3.5 ZI (BIT) IMPLANT
BNDG ELASTIC 4INX 5YD STR LF (GAUZE/BANDAGES/DRESSINGS) IMPLANT
BNDG ELASTIC 4X5.8 VLCR STR LF (GAUZE/BANDAGES/DRESSINGS) IMPLANT
BNDG ELASTIC 6INX 5YD STR LF (GAUZE/BANDAGES/DRESSINGS) IMPLANT
BNDG GAUZE DERMACEA FLUFF 4 (GAUZE/BANDAGES/DRESSINGS) ×6 IMPLANT
BRUSH SCRUB EZ PLAIN DRY (MISCELLANEOUS) ×6 IMPLANT
COVER MAYO STAND STRL (DRAPES) ×3 IMPLANT
COVER SURGICAL LIGHT HANDLE (MISCELLANEOUS) ×3 IMPLANT
CUFF TRNQT CYL 34X4.125X (TOURNIQUET CUFF) ×3 IMPLANT
DRAPE C-ARM 42X72 X-RAY (DRAPES) ×3 IMPLANT
DRAPE C-ARMOR (DRAPES) ×3 IMPLANT
DRAPE HALF SHEET 40X57 (DRAPES) ×3 IMPLANT
DRAPE U-SHAPE 47X51 STRL (DRAPES) ×3 IMPLANT
DRIVER RETENTION T15 LONG (ORTHOPEDIC DISPOSABLE SUPPLIES) IMPLANT
DRSG EMULSION OIL 3X3 NADH (GAUZE/BANDAGES/DRESSINGS) IMPLANT
DRSG MEPITEL 4X7.2 (GAUZE/BANDAGES/DRESSINGS) IMPLANT
ELECTRODE REM PT RTRN 9FT ADLT (ELECTROSURGICAL) ×3 IMPLANT
GAUZE SPONGE 4X4 12PLY STRL (GAUZE/BANDAGES/DRESSINGS) ×6 IMPLANT
GLOVE BIO SURGEON STRL SZ7.5 (GLOVE) ×3 IMPLANT
GLOVE BIO SURGEON STRL SZ8 (GLOVE) ×3 IMPLANT
GLOVE BIOGEL PI IND STRL 7.5 (GLOVE) ×3 IMPLANT
GLOVE BIOGEL PI IND STRL 8 (GLOVE) ×3 IMPLANT
GLOVE SURG ORTHO LTX SZ7.5 (GLOVE) ×6 IMPLANT
GOWN STRL REUS W/ TWL LRG LVL3 (GOWN DISPOSABLE) ×6 IMPLANT
GOWN STRL REUS W/ TWL XL LVL3 (GOWN DISPOSABLE) ×3 IMPLANT
KIT BASIN OR (CUSTOM PROCEDURE TRAY) ×3 IMPLANT
KIT TURNOVER KIT B (KITS) ×3 IMPLANT
KWIRE ALPS MXV 1.6X6 ZI (WIRE) IMPLANT
KWIRE TROC 1.25X150 (WIRE) IMPLANT
MANIFOLD NEPTUNE II (INSTRUMENTS) ×3 IMPLANT
NDL HYPO 21X1.5 SAFETY (NEEDLE) IMPLANT
NEEDLE HYPO 21X1.5 SAFETY (NEEDLE) IMPLANT
NS IRRIG 1000ML POUR BTL (IV SOLUTION) ×3 IMPLANT
PACK GENERAL/GYN (CUSTOM PROCEDURE TRAY) ×3 IMPLANT
PACK ORTHO EXTREMITY (CUSTOM PROCEDURE TRAY) ×3 IMPLANT
PAD ABD 8X10 STRL (GAUZE/BANDAGES/DRESSINGS) IMPLANT
PAD ARMBOARD POSITIONER FOAM (MISCELLANEOUS) ×6 IMPLANT
PAD CAST 4YDX4 CTTN HI CHSV (CAST SUPPLIES) IMPLANT
PADDING CAST COTTON 6X4 STRL (CAST SUPPLIES) IMPLANT
PLATE LAT FIB 6H RT (Plate) IMPLANT
SCREW CANN PT 4X26 NS (Screw) IMPLANT
SCREW LOCK MDS 2.710 (Screw) IMPLANT
SCREW LOCK MDS 2.7X16 (Screw) IMPLANT
SCREW NLOCK 3.5X22 (Screw) IMPLANT
SCREW NLOCK 3.5X44 (Screw) IMPLANT
SCREW NLOCK 3.5X48 (Screw) IMPLANT
SCREW NLOCK MVX 2.7X16 (Screw) IMPLANT
SCREW NON-LOCK 3.5X10 (Screw) IMPLANT
SCREW NON-LOCK 3.5X12 (Screw) IMPLANT
SCREW PT 4.0X26MM (Screw) ×4 IMPLANT
SPONGE T-LAP 18X18 ~~LOC~~+RFID (SPONGE) ×3 IMPLANT
STAPLER SKIN PROX 35W (STAPLE) IMPLANT
SUCTION TUBE FRAZIER 10FR DISP (SUCTIONS) ×3 IMPLANT
SUT ETHILON 2 0 FS 18 (SUTURE) ×6 IMPLANT
SUT PDS AB 2-0 CT1 27 (SUTURE) IMPLANT
SUT VIC AB 2-0 CT1 TAPERPNT 27 (SUTURE) ×6 IMPLANT
TOWEL GREEN STERILE (TOWEL DISPOSABLE) ×6 IMPLANT
TOWEL GREEN STERILE FF (TOWEL DISPOSABLE) ×3 IMPLANT
TUBE CONNECTING 12X1/4 (SUCTIONS) ×3 IMPLANT
UNDERPAD 30X36 HEAVY ABSORB (UNDERPADS AND DIAPERS) ×3 IMPLANT
WATER STERILE IRR 1000ML POUR (IV SOLUTION) ×3 IMPLANT

## 2024-03-21 NOTE — Anesthesia Procedure Notes (Signed)
 Anesthesia Regional Block: Popliteal block   Pre-Anesthetic Checklist: , timeout performed,  Correct Patient, Correct Site, Correct Laterality,  Correct Procedure, Correct Position, site marked,  Risks and benefits discussed,  Surgical consent,  Pre-op evaluation,  At surgeon's request and post-op pain management  Laterality: Right  Prep: chloraprep       Needles:  Injection technique: Single-shot  Needle Type: Echogenic Stimulator Needle     Needle Length: 5cm  Needle Gauge: 22     Additional Needles:   Procedures:, nerve stimulator,,, ultrasound used (permanent image in chart),,     Nerve Stimulator or Paresthesia:  Response: foot, 0.45 mA  Additional Responses:   Narrative:  Start time: 03/21/2024 9:50 AM End time: 03/21/2024 9:53 AM Injection made incrementally with aspirations every 5 mL.  Performed by: Personally  Anesthesiologist: Mallory Manus, MD  Additional Notes: Functioning IV was confirmed and monitors were applied.  A 50mm 22ga Arrow echogenic stimulator needle was used. Sterile prep and drape,hand hygiene and sterile gloves were used. Ultrasound guidance: relevant anatomy identified, needle position confirmed, local anesthetic spread visualized around nerve(s)., vascular puncture avoided.  Image printed for medical record. Negative aspiration and negative test dose prior to incremental administration of local anesthetic. The patient tolerated the procedure well.

## 2024-03-21 NOTE — H&P (Signed)
        Orthopaedic Trauma Service (OTS) Consult   Patient ID: Karen Kidd MRN: [id number] DOB/AGE: 53-Dec-1972 53 y.o.   HPI: Karen Kidd is an 53 y.o. female who sustained a fall down some stairs on 03/11/2024.  Had immediate onset of pain and inability to bear weight.  Also deformity noted to the right ankle.  She presented to the emergency department where she was found to have a right ankle fracture dislocation.  She was splinted and discharged from the ED.  Followed up at the orthopedic trauma office on 03/20/2024.  Follow-up x-rays showed persistent dislocation of her right ankle.  Upon chart review it does not appear that reduction maneuver was attempted.  Fortunately soft tissue swelling had subsided enough for us  to proceed with ORIF of her right ankle which is planned for today.  Risks and benefits of surgery reviewed with the patient and she wishes to proceed with surgery.  History reviewed. No pertinent past medical history.  Past Surgical History:  Procedure Laterality Date   CHOLECYSTECTOMY      History reviewed. No pertinent family history.  Social History:  reports that she has never smoked. She has never used smokeless tobacco. She reports current alcohol use. She reports that she does not use drugs.  Allergies: No Known Allergies  Medications: I have reviewed the patient's current medications. Current Outpatient Medications  Medication Instructions   ibuprofen  (ADVIL ) 800 mg, Oral, 3 times daily   oxyCODONE -acetaminophen  (PERCOCET) 5-325 MG tablet 1 tablet, Oral, Every 4 hours PRN     No results found for this or any previous visit (from the past 48 hours).  No results found.  Intake/Output    None      ROS R ankle pain   Blood pressure 133/80, pulse 77, temperature 98.6 F (37 C), temperature source Oral, resp. rate 18, height 5' 2 (1.575 m), weight 99.8 kg, SpO2 96%. Physical Exam Gen: NAD, pleasant, appears well Lungs:  unlabored Cardiac: reg Right Lower Extremity   Splint fitting well  Swelling stable  Ext warm  Skin wrinkles   No pressure wounds, no threatened skin but splint not removed in its entirety. This will be done in OR   Distal motor and sensory functions intact  Compartments are soft  No pain with passive stretching of toes   Assessment/Plan:  53 year old female who fall downstairs with right ankle fracture dislocation  Closed right ankle fracture dislocation  OR today for ORIF of lateral and medial malleolus and possibly syndesmotic fixation  if needed  Risks and benefits of surgery reviewed the patient and she wishes to proceed  She will be nonweightbearing on her right leg for 6 to 8 weeks  Splint for 2 weeks and then convert to cam boot to begin range of motion   Outpatient procedure   Continue with aggressive ice and elevation postoperatively for swelling and pain control   Aspirin  81 mg daily for 30 days for DVT and PE prophylaxis   Francis MICAEL Mt, PA-C [phone] (C) 03/21/2024, 9:09 AM  [facility] [address] [place] [address] [phone] [id number] (F)    After 5pm and on the weekends please log on to Amion, go to orthopaedics and the look under the [facility] Call for the provider(s) on call. You can also call our office at [phone] and then follow the prompts to be connected to the call team.

## 2024-03-21 NOTE — Progress Notes (Signed)
 PATIENT VERIFIED AND CONFIRMED NAME, DOB, ALLERGIES, NPO STATUS, PROCEDURE, AND SURGEON WITH SURGEON, INTERPRETER (# [phone] LUIS) AND MYSELF

## 2024-03-21 NOTE — Transfer of Care (Signed)
 Immediate Anesthesia Transfer of Care Note  Patient: Karen Kidd  Procedure(s) Performed: OPEN REDUCTION INTERNAL FIXATION (ORIF) ANKLE FRACTURE, RIGHT (Right: Ankle) REPAIR, SYNDESMOSIS, ANKLE (Right)  Patient Location: PACU  Anesthesia Type:General and Regional  Level of Consciousness: drowsy  Airway & Oxygen Therapy: Patient Spontanous Breathing and Patient connected to face mask oxygen  Post-op Assessment: Report given to RN and Post -op Vital signs reviewed and stable  Post vital signs: Reviewed and stable  Last Vitals:  Vitals Value Taken Time  BP    Temp    Pulse 92 03/21/24 13:49  Resp 19 03/21/24 13:49  SpO2 100 % 03/21/24 13:49  Vitals shown include unfiled device data.  Last Pain:  Vitals:   03/21/24 0945  TempSrc:   PainSc: 0-No pain         Complications: No notable events documented.

## 2024-03-21 NOTE — Anesthesia Preprocedure Evaluation (Addendum)
 Anesthesia Evaluation  Patient identified by MRN, date of birth, ID band Patient awake    Reviewed: Allergy & Precautions, H&P , NPO status , Patient's Chart, lab work & pertinent test results  Airway Mallampati: II  TM Distance: >3 FB Neck ROM: Full    Dental no notable dental hx. (+) Edentulous Upper, Poor Dentition, Missing,    Pulmonary neg pulmonary ROS   Pulmonary exam normal breath sounds clear to auscultation       Cardiovascular Exercise Tolerance: Good negative cardio ROS Normal cardiovascular exam Rhythm:Regular Rate:Normal     Neuro/Psych negative neurological ROS  negative psych ROS   GI/Hepatic negative GI ROS, Neg liver ROS,,,  Endo/Other  negative endocrine ROS    Renal/GU negative Renal ROS  negative genitourinary   Musculoskeletal negative musculoskeletal ROS (+)    Abdominal   Peds negative pediatric ROS (+)  Hematology negative hematology ROS (+)   Anesthesia Other Findings   Reproductive/Obstetrics negative OB ROS                              Anesthesia Physical Anesthesia Plan  ASA: 3  Anesthesia Plan: General and Regional   Post-op Pain Management: Minimal or no pain anticipated, Tylenol  PO (pre-op)*, Celebrex  PO (pre-op)* and Precedex   Induction: Intravenous  PONV Risk Score and Plan: 3 and Ondansetron , Dexamethasone  and Treatment may vary due to age or medical condition  Airway Management Planned: Oral ETT and LMA  Additional Equipment: None  Intra-op Plan:   Post-operative Plan: Extubation in OR  Informed Consent: I have reviewed the patients History and Physical, chart, labs and discussed the procedure including the risks, benefits and alternatives for the proposed anesthesia with the patient or authorized representative who has indicated his/her understanding and acceptance.     Dental advisory given  Plan Discussed with: Anesthesiologist  and CRNA  Anesthesia Plan Comments: (Discussed both nerve block for pain relief post-op and GA; including NV, sore throat, dental injury, and pulmonary complications)         Anesthesia Quick Evaluation

## 2024-03-21 NOTE — Discharge Instructions (Addendum)
 Orthopaedic Trauma Service Discharge Instructions   General Discharge Instructions  Orthopaedic Injuries:  Right ankle fracture treated with open reduction internal fixation using plate and screws  WEIGHT BEARING STATUS: Nonweight bearing right lower extremity.  Use walker or crutches to mobilize  RANGE OF MOTION/ACTIVITY: Unrestricted range of motion of right knee and toes.  You are not able to move your ankle as you are splinted.  Please bring black cam boot to your first postoperative appointment  Bone health: Labs show vitamin D  deficiency.  Please take vitamin D3 5000 IUs daily for supplementation  Review the following resource for additional information regarding bone health  [email or web address]  Wound Care: Do not remove splint.  Keep splint clean and dry.  Will remove at your first postoperative appointment  DVT/PE prophylaxis: Aspirin  81 mg daily for 30 days for blood clot prevention  Diet: as you were eating previously.  Can use over the counter stool softeners and bowel preparations, such as Miralax, to help with bowel movements.  Narcotics can be constipating.  Be sure to drink plenty of fluids  PAIN MEDICATION USE AND EXPECTATIONS  You have likely been given narcotic medications to help control your pain.  After a traumatic event that results in an fracture (broken bone) with or without surgery, it is ok to use narcotic pain medications to help control one's pain.  We understand that everyone responds to pain differently and each individual patient will be evaluated on a regular basis for the continued need for narcotic medications. Ideally, narcotic medication use should last no more than 6-8 weeks (coinciding with fracture healing).   As a patient it is your responsibility as well to monitor narcotic medication use and report the amount and frequency you use these medications when you come to your office visit.   We would also advise that if you  are using narcotic medications, you should take a dose prior to therapy to maximize you participation.  IF YOU ARE ON NARCOTIC MEDICATIONS IT IS NOT PERMISSIBLE TO OPERATE A MOTOR VEHICLE (MOTORCYCLE/CAR/TRUCK/MOPED) OR HEAVY MACHINERY DO NOT MIX NARCOTICS WITH OTHER CNS (CENTRAL NERVOUS SYSTEM) DEPRESSANTS SUCH AS ALCOHOL   POST-OPERATIVE OPIOID TAPER INSTRUCTIONS: It is important to wean off of your opioid medication as soon as possible. If you do not need pain medication after your surgery it is ok to stop day one. Opioids include: Codeine, Hydrocodone(Norco, Vicodin), Oxycodone (Percocet, oxycontin ) and hydromorphone amongst others.  Long term and even short term use of opiods can cause: Increased pain response Dependence Constipation Depression Respiratory depression And more.  Withdrawal symptoms can include Flu like symptoms Nausea, vomiting And more Techniques to manage these symptoms Hydrate well Eat regular healthy meals Stay active Use relaxation techniques(deep breathing, meditating, yoga) Do Not substitute Alcohol to help with tapering If you have been on opioids for less than two weeks and do not have pain than it is ok to stop all together.  Plan to wean off of opioids This plan should start within one week post op of your fracture surgery  Maintain the same interval or time between taking each dose and first decrease the dose.  Cut the total daily intake of opioids by one tablet each day Next start to increase the time between doses. The last dose that should be eliminated is the evening dose.    STOP SMOKING OR USING NICOTINE PRODUCTS!!!!  As discussed nicotine severely impairs your body's ability to heal surgical and traumatic wounds but also impairs bone healing.  Wounds and bone heal by forming microscopic blood vessels (angiogenesis) and nicotine is a vasoconstrictor (essentially, shrinks blood vessels).  Therefore, if vasoconstriction occurs to these  microscopic blood vessels they essentially disappear and are unable to deliver necessary nutrients to the healing tissue.  This is one modifiable factor that you can do to dramatically increase your chances of healing your injury.    (This means no smoking, no nicotine gum, patches, etc)  DO NOT USE NONSTEROIDAL ANTI-INFLAMMATORY DRUGS (NSAID'S)  Using products such as Advil  (ibuprofen ), Aleve (naproxen), Motrin  (ibuprofen ) for additional pain control during fracture healing can delay and/or prevent the healing response.  If you would like to take over the counter (OTC) medication, Tylenol  (acetaminophen ) is ok.  However, some narcotic medications that are given for pain control contain acetaminophen  as well. Therefore, you should not exceed more than 4000 mg of tylenol  in a day if you do not have liver disease.  Also note that there are may OTC medicines, such as cold medicines and allergy medicines that my contain tylenol  as well.  If you have any questions about medications and/or interactions please ask your doctor/PA or your pharmacist.      ICE AND ELEVATE INJURED/OPERATIVE EXTREMITY  Using ice and elevating the injured extremity above your heart can help with swelling and pain control.  Icing in a pulsatile fashion, such as 20 minutes on and 20 minutes off, can be followed.    Do not place ice directly on skin. Make sure there is a barrier between to skin and the ice pack.    Using frozen items such as frozen peas works well as the conform nicely to the are that needs to be iced.  USE AN ACE WRAP OR TED HOSE FOR SWELLING CONTROL  In addition to icing and elevation, Ace wraps or TED hose are used to help limit and resolve swelling.  It is recommended to use Ace wraps or TED hose until you are informed to stop.    When using Ace Wraps start the wrapping distally (farthest away from the body) and wrap proximally (closer to the body)   Example: If you had surgery on your leg and you do not have a  splint on, start the ace wrap at the toes and work your way up to the thigh        If you had surgery on your upper extremity and do not have a splint on, start the ace wrap at your fingers and work your way up to the upper arm  IF YOU ARE IN A SPLINT OR CAST DO NOT REMOVE IT FOR ANY REASON   If your splint gets wet for any reason please contact the office immediately. You may shower in your splint or cast as long as you keep it dry.  This can be done by wrapping in a cast cover or garbage back (or similar)  Do Not stick any thing down your splint or cast such as pencils, money, or hangers to try and scratch yourself with.  If you feel itchy take benadryl as prescribed on the bottle for itching  IF YOU ARE IN A CAM BOOT (BLACK BOOT)  You may remove boot periodically. Perform daily dressing changes as noted below.  Wash the liner of the boot regularly and wear a sock when wearing the boot. It is recommended that you sleep in the boot until told otherwise    Call office for the following: Temperature greater than 101F Persistent nausea and  vomiting Severe uncontrolled pain Redness, tenderness, or signs of infection (pain, swelling, redness, odor or green/yellow discharge around the site) Difficulty breathing, headache or visual disturbances Hives Persistent dizziness or light-headedness Extreme fatigue Any other questions or concerns you may have after discharge  In an emergency, call 911 or go to an Emergency Department at a nearby hospital  HELPFUL INFORMATION  If you had a block, it will wear off between 8-24 hrs postop typically.  This is period when your pain may go from nearly zero to the pain you would have had postop without the block.  This is an abrupt transition but nothing dangerous is happening.  You may take an extra dose of narcotic when this happens.  You should wean off your narcotic medicines as soon as you are able.  Most patients will be off or using minimal narcotics  before their first postop appointment.   We suggest you use the pain medication the first night prior to going to bed, in order to ease any pain when the anesthesia wears off. You should avoid taking pain medications on an empty stomach as it will make you nauseous.  Do not drink alcoholic beverages or take illicit drugs when taking pain medications.  In most states it is against the law to drive while you are in a splint or sling.  And certainly against the law to drive while taking narcotics.  You may return to work/school in the next couple of days when you feel up to it.   Pain medication may make you constipated.  Below are a few solutions to try in this order: Decrease the amount of pain medication if you aren't having pain. Drink lots of decaffeinated fluids. Drink prune juice and/or each dried prunes  If the first 3 don't work start with additional solutions Take Colace - an over-the-counter stool softener Take Senokot - an over-the-counter laxative Take Miralax - a stronger over-the-counter laxative     CALL THE OFFICE WITH ANY QUESTIONS OR CONCERNS: [phone]   VISIT OUR WEBSITE FOR ADDITIONAL INFORMATION: [email or web address]

## 2024-03-21 NOTE — Anesthesia Procedure Notes (Signed)
 Anesthesia Regional Block: Adductor canal block   Pre-Anesthetic Checklist: , timeout performed,  Correct Patient, Correct Site, Correct Laterality,  Correct Procedure, Correct Position, site marked,  Risks and benefits discussed,  Surgical consent,  Pre-op evaluation,  At surgeon's request and post-op pain management  Laterality: Right  Prep: chloraprep       Needles:  Injection technique: Single-shot  Needle Type: Echogenic Stimulator Needle     Needle Length: 5cm  Needle Gauge: 22     Additional Needles:   Procedures:,,,, ultrasound used (permanent image in chart),,    Narrative:  Start time: 03/21/2024 9:55 AM End time: 03/21/2024 9:59 AM Injection made incrementally with aspirations every 5 mL.  Performed by: Personally  Anesthesiologist: Mallory Manus, MD  Additional Notes: Functioning IV was confirmed and monitors were applied.  A 50mm 22ga Arrow echogenic stimulator needle was used. Sterile prep and drape,hand hygiene and sterile gloves were used. Ultrasound guidance: relevant anatomy identified, needle position confirmed, local anesthetic spread visualized around nerve(s)., vascular puncture avoided.  Image printed for medical record. Negative aspiration and negative test dose prior to incremental administration of local anesthetic. The patient tolerated the procedure well.

## 2024-03-21 NOTE — Anesthesia Postprocedure Evaluation (Signed)
 Anesthesia Post Note  Patient: Karen Kidd  Procedure(s) Performed: OPEN REDUCTION INTERNAL FIXATION (ORIF) ANKLE FRACTURE, RIGHT (Right: Ankle) REPAIR, SYNDESMOSIS, ANKLE (Right)     Patient location during evaluation: PACU Anesthesia Type: Regional and General Level of consciousness: awake and alert Pain management: pain level controlled Vital Signs Assessment: post-procedure vital signs reviewed and stable Respiratory status: spontaneous breathing, nonlabored ventilation, respiratory function stable and patient connected to nasal cannula oxygen Cardiovascular status: blood pressure returned to baseline and stable Postop Assessment: no apparent nausea or vomiting Anesthetic complications: no   No notable events documented.  Last Vitals:  Vitals:   03/21/24 1415 03/21/24 1430  BP: 125/68 123/73  Pulse: 84 82  Resp: 16 16  Temp:  36.9 C  SpO2: 97% 97%    Last Pain:  Vitals:   03/21/24 1430  TempSrc:   PainSc: 0-No pain                 Forest Pruden

## 2024-03-21 NOTE — Progress Notes (Signed)
 Orthopedic Tech Progress Note Patient Details:  Karen Kidd 01/31/1971 [id number]  Ortho Devices Type of Ortho Device: CAM walker Ortho Device/Splint Interventions: Ordered      Karen Kidd 03/21/2024, 2:44 PM

## 2024-03-21 NOTE — Anesthesia Procedure Notes (Signed)
 Procedure Name: LMA Insertion Date/Time: 03/21/2024 11:39 AM  Performed by: Mannie Krystal LABOR, CRNAPre-anesthesia Checklist: Patient identified, Emergency Drugs available, Suction available and Patient being monitored Patient Re-evaluated:Patient Re-evaluated prior to induction Oxygen Delivery Method: Circle system utilized Preoxygenation: Pre-oxygenation with 100% oxygen Induction Type: IV induction LMA: LMA inserted LMA Size: 4.0 Number of attempts: 1 Placement Confirmation: positive ETCO2 and breath sounds checked- equal and bilateral Tube secured with: Tape Dental Injury: Teeth and Oropharynx as per pre-operative assessment

## 2024-03-22 ENCOUNTER — Encounter (HOSPITAL_COMMUNITY): Payer: Self-pay | Admitting: Orthopedic Surgery

## 2024-04-22 NOTE — Op Note (Signed)
 12:06 PM  PATIENT:  Karen Kidd  1971/03/29 female   MEDICAL RECORD NUMBER: [id number]  PRE-OPERATIVE DIAGNOSIS:   1. RIGHT TRIMALLEOLAR ANKLE FRACTURE 2. ANKLE DISLOCATION 3. SYNDESMOTIC DISRUPTION  POST-OPERATIVE DIAGNOSIS:   1. RIGHT TRIMALLEOLAR ANKLE FRACTURE 2. ANKLE DISLOCATION 3. SYNDESMOTIC DISRUPTION  PROCEDURE:   OPEN REDUCTION INTERNAL FIXATION OF LEFT TRIMALLEOLAR ANKLE FRACTURE WITHOUT FIXATION OF THE POSTERIOR LIP OPEN TREATMENT OF ANKLE DISLOCATION   OPEN REDUCTION INTERNAL FIXATION OF RIGHT ANKLE SYNDESMOSIS MANUAL APPLICATION OF STRESS UNDER FLUOROSCOPY  SURGEON:  Ozell DEL. Celena, M.D.  ASSISTANTBETHA SMOKE.  ANESTHESIA:  General supplemented with regional.  COMPLICATIONS:  None.  TOURNIQUET: None.  ESTIMATED BLOOD LOSS:  50 mL.  DISPOSITION:  To PACU.  CONDITION:  Intubated, hemodynamically stable.  DELAY START OF DVT PROPHYLAXIS BECAUSE OF BLEEDING RISK: NO  BRIEF SUMMARY AND INDICATIONS FOR PROCEDURE:  The patient is a 53 y.o. with multiple medical problems who sustained a fracture dislocation of the ankle fall from the bed, treated with reduction at the time of presentation to the Emergency Department. However, subsequent x-rays demonstrated recurrent subluxation. The risks and benefits of surgery were discussed with patient and spouse including the possibility of infection, DVT, PE, nerve injury, vessel injury, loss of motion, arthritis, symptomatic hardware, heart attack, stroke and need for further surgery, among others. After acknowledging these risks, consent was provided to proceed.  SUMMARY OF PROCEDURE:  The patient was taken to the operating room after administration of preoperative antibiotics.  The operative lower extremity was prepped and draped in the usual sterile fashion, first with chlorhexidene and then betadine scrub and paint.   A timeout was held after draping. An incision was made directly over the lateral malleolus with  careful dissection to avoid injury to the superficial peroneal nerve.  The periosteum was left intact as we continued deep dissection.  With the assistance of distal manipulation and placement of tenaculums, we were able to restore near anatomic alignment with  reduction of the primary fracture fragments. I was able to hold this interdigitated during application of a lateral malleolus plate.  We used the Zimmer 2.0 system and placed locked fixation in the shaft and distal lateral malleolus, confirming plate position with x-ray .  Final images showed appropriate reductional replacement, trajectory, and length.   Next, attention was turned to the medial side.  Here, a curvilinear anterior incision was used for direct access to the medial malleolus fracture site.  A pointed tenaculum was used to secure the reduction and compression.  Two cannulated screws were then used to achieve fixation. The C-arm was then brought back in and AP, lateral and mortise views showed restoration of ankle alignment reduction.    Evaluation of the syndesmosis with external rotation stress applied under fluoroscopy demonstrated widening and instability. Consequently two quadricortical screws were placed with 15 degrees of anteversion while holding it reduced.  The posterior malleolar fracture fragment was then evaluated. It did not constitute a significant portion of the articular surface, and so we did not perform fixation.   Lastly, attention was turned to the dislocated ankle joint. It was irrigated thoroughly removing some small fracture debris. The talus was not found to have traumatic changes and open reduction appeared concentric. We debated additional application of K-wires but found sufficient stabilization with maintenance of neutral ankle position.   Wounds were irrigated once more and then closed in standard layered fashion using 2-0 Vicryl and 2-0 nylon.  A sterile gently compressive dressing was  applied, and then a  posterior and stirrup splint with the ankle extended to neutral. Francis Mt, PA-C was present and assisting throughout. An assistant was necessary for retraction and obtaining provisional and definitive fixation with this highly unstable fracture.   PROGNOSIS: The patient will be nonweightbearing in the splint with ice and elevation.  We will plan to see back in the office in two weeks for removal of sutures and transition into a Cam boot with unrestricted range of motion.  Weightbearing at 8 weeks.

## 2024-07-29 ENCOUNTER — Ambulatory Visit: Payer: Self-pay | Admitting: Student
